# Patient Record
Sex: Female | Born: 2000 | Hispanic: Yes | Marital: Single | Smoking: Never smoker
Health system: Southern US, Community
[De-identification: ages and names within clinical notes are randomized; demographics above are authoritative.]

---

## 2018-12-31 ENCOUNTER — Emergency Department (HOSPITAL_COMMUNITY)
Admission: EM | Admit: 2018-12-31 | Discharge: 2019-01-01 | Disposition: A | Discharge status: Home / Self Care | Payer: Self-pay | Attending: Emergency Medicine | Admitting: Emergency Medicine

## 2018-12-31 ENCOUNTER — Encounter (HOSPITAL_COMMUNITY): Payer: Self-pay | Admitting: *Deleted

## 2018-12-31 DIAGNOSIS — K922 Gastrointestinal hemorrhage, unspecified: Secondary | ICD-10-CM | POA: Insufficient documentation

## 2018-12-31 LAB — COMPREHENSIVE METABOLIC PANEL
ALK PHOS: 39 U/L (ref 38–126)
ALT: 20 U/L (ref 0–44)
AST: 21 U/L (ref 15–41)
Albumin: 5.4 g/dL — ABNORMAL HIGH (ref 3.5–5.0)
Anion gap: 9 (ref 5–15)
BUN: 12 mg/dL (ref 6–20)
CALCIUM: 9.7 mg/dL (ref 8.9–10.3)
CO2: 23 mmol/L (ref 22–32)
Chloride: 107 mmol/L (ref 98–111)
Creatinine, Ser: 0.54 mg/dL (ref 0.44–1.00)
GFR calc Af Amer: >60 mL/min (ref >60)
GFR calc non Af Amer: >60 mL/min (ref >60)
Glucose, Bld: 92 mg/dL (ref 70–99)
Potassium: 3.7 mmol/L (ref 3.5–5.1)
Sodium: 139 mmol/L (ref 135–145)
Total Bilirubin: 0.4 mg/dL (ref 0.3–1.2)
Total Protein: 8.4 g/dL — ABNORMAL HIGH (ref 6.5–8.1)

## 2018-12-31 LAB — CBC
HCT: 37.5 % (ref 36.0–46.0)
Hemoglobin: 12.4 g/dL (ref 12.0–15.0)
MCH: 29.1 pg (ref 26.0–34.0)
MCHC: 33.1 g/dL (ref 30.0–36.0)
MCV: 88 fL (ref 80.0–100.0)
Platelets: 342 10*3/uL (ref 150–400)
RBC: 4.26 MIL/uL (ref 3.87–5.11)
RDW: 12.6 % (ref 11.5–15.5)
WBC: 9.7 10*3/uL (ref 4.0–10.5)
nRBC: 0 % (ref 0.0–0.2)

## 2018-12-31 LAB — URINALYSIS, ROUTINE W REFLEX MICROSCOPIC
Bilirubin Urine: NEGATIVE
GLUCOSE, UA: NEGATIVE mg/dL
Hgb urine dipstick: NEGATIVE
Ketones, ur: NEGATIVE mg/dL
Leukocytes,Ua: NEGATIVE
Nitrite: NEGATIVE
PROTEIN: NEGATIVE mg/dL
Specific Gravity, Urine: 1.02 (ref 1.005–1.030)
pH: 5 (ref 5.0–8.0)

## 2018-12-31 LAB — LIPASE, BLOOD: Lipase: 37 U/L (ref 11–51)

## 2018-12-31 LAB — I-STAT BETA HCG BLOOD, ED (MC, WL, AP ONLY): I-stat hCG, quantitative: <5 m[IU]/mL (ref <5)

## 2018-12-31 MED ORDER — SODIUM CHLORIDE 0.9% FLUSH: 3.0000 mL | Freq: Once | INTRAVENOUS | Status: DC

## 2018-12-31 MED ORDER — ONDANSETRON HCL 4 MG/2ML IJ SOLN
4.0000 mg | Freq: Once | INTRAMUSCULAR | Status: AC
  Administered 2018-12-31: 4 mg via INTRAVENOUS
  Filled 2018-12-31: qty 2

## 2018-12-31 MED ORDER — IOHEXOL 300 MG/ML  SOLN
30.0000 mL | Freq: Once | INTRAMUSCULAR | Status: AC | PRN
  Administered 2018-12-31: 30 mL via ORAL

## 2018-12-31 NOTE — ED Provider Notes (Signed)
[facility]-EMERGENCY DEPT Provider Note: Emily Dell, MD, FACEP  CSN: [id number] MRN: [id number] ARRIVAL: 12/31/18 at 1755 ROOM: WA23/[facility]23   CHIEF COMPLAINT  Rectal Bleeding   HISTORY OF PRESENT ILLNESS  12/31/18 11:03 PM Emily Garcia is a 18 y.o. female without significant past medical history.  She is here with a 3-day history of rectal bleeding.  The bleeding was mild at first but has now become severe enough to discolor the water in the toilet.  She describes it as blood around the stool.  The stool is of normal color, not black, but softer than usual.  She has not had frank diarrhea.  She has had associated nausea but no vomiting.  She has had left-sided abdominal pain which is fairly well localized, described as sharp, and rated as a 7 out of 10.  It is worse with movement or palpation.  She has not had a fever.  She has no history of similar symptoms.   History reviewed. No pertinent past medical history.  History reviewed. No pertinent surgical history.  No family history on file.  Social History   Tobacco Use  . Smoking status: Never Smoker  . Smokeless tobacco: Never Used  Substance Use Topics  . Alcohol use: Never    Frequency: Never  . Drug use: Not on file    Prior to Admission medications   Not on File    Allergies Patient has no known allergies.   REVIEW OF SYSTEMS  Negative except as noted here or in the History of Present Illness.   PHYSICAL EXAMINATION  Initial Vital Signs Blood pressure 107/63, pulse 67, temperature 98.7 F (37.1 C), temperature source Oral, resp. rate 16, weight 59.7 kg, last menstrual period 12/10/2018, SpO2 100 %.  Examination General: Well-developed, well-nourished female in no acute distress; appearance consistent with age of record HENT: normocephalic; atraumatic Eyes: pupils equal, round and reactive to light; extraocular muscles intact Neck: supple Heart: regular rate and rhythm Lungs: clear to auscultation  bilaterally Abdomen: soft; nondistended; left-sided tenderness; no masses or hepatosplenomegaly; bowel sounds present Rectal: Normal sphincter tone; no gross blood in rectal vault Extremities: No deformity; full range of motion; pulses normal Neurologic: Awake, alert; motor function intact in all extremities and symmetric; no facial droop Skin: Warm and dry Psychiatric: Normal mood and affect   RESULTS  Summary of this visit's results, reviewed by myself:   EKG Interpretation  Date/Time:    Ventricular Rate:    PR Interval:    QRS Duration:   QT Interval:    QTC Calculation:   R Axis:     Text Interpretation:        Laboratory Studies: Results for orders placed or performed during the hospital encounter of 12/31/18 (from the past 24 hour(s))  Lipase, blood     Status: None   Collection Time: 12/31/18  7:43 PM  Result Value Ref Range   Lipase 37 11 - 51 U/L  Comprehensive metabolic panel     Status: Abnormal   Collection Time: 12/31/18  7:43 PM  Result Value Ref Range   Sodium 139 135 - 145 mmol/L   Potassium 3.7 3.5 - 5.1 mmol/L   Chloride 107 98 - 111 mmol/L   CO2 23 22 - 32 mmol/L   Glucose, Bld 92 70 - 99 mg/dL   BUN 12 6 - 20 mg/dL   Creatinine, Ser 3.41 0.44 - 1.00 mg/dL   Calcium 9.7 8.9 - 96.2 mg/dL   Total Protein 8.4 (H)  6.5 - 8.1 g/dL   Albumin 5.4 (H) 3.5 - 5.0 g/dL   AST 21 15 - 41 U/L   ALT 20 0 - 44 U/L   Alkaline Phosphatase 39 38 - 126 U/L   Total Bilirubin 0.4 0.3 - 1.2 mg/dL   GFR calc non Af Amer >60 >60 mL/min   GFR calc Af Amer >60 >60 mL/min   Anion gap 9 5 - 15  CBC     Status: None   Collection Time: 12/31/18  7:43 PM  Result Value Ref Range   WBC 9.7 4.0 - 10.5 K/uL   RBC 4.26 3.87 - 5.11 MIL/uL   Hemoglobin 12.4 12.0 - 15.0 g/dL   HCT 47.4 25.9 - 56.3 %   MCV 88.0 80.0 - 100.0 fL   MCH 29.1 26.0 - 34.0 pg   MCHC 33.1 30.0 - 36.0 g/dL   RDW 87.5 64.3 - 32.9 %   Platelets 342 150 - 400 K/uL   nRBC 0.0 0.0 - 0.2 %  Urinalysis,  Routine w reflex microscopic     Status: None   Collection Time: 12/31/18  7:43 PM  Result Value Ref Range   Color, Urine YELLOW YELLOW   APPearance CLEAR CLEAR   Specific Gravity, Urine 1.020 1.005 - 1.030   pH 5.0 5.0 - 8.0   Glucose, UA NEGATIVE NEGATIVE mg/dL   Hgb urine dipstick NEGATIVE NEGATIVE   Bilirubin Urine NEGATIVE NEGATIVE   Ketones, ur NEGATIVE NEGATIVE mg/dL   Protein, ur NEGATIVE NEGATIVE mg/dL   Nitrite NEGATIVE NEGATIVE   Leukocytes,Ua NEGATIVE NEGATIVE  I-Stat beta hCG blood, ED     Status: None   Collection Time: 12/31/18  8:03 PM  Result Value Ref Range   I-stat hCG, quantitative <5.0 <5 mIU/mL   Comment 3           Imaging Studies: Ct Abdomen Pelvis W Contrast  Result Date: 01/01/2019 CLINICAL DATA:  Left abdominal and rectal pain with bowel movements. Rectal bleeding for 3 days. EXAM: CT ABDOMEN AND PELVIS WITH CONTRAST TECHNIQUE: Multidetector CT imaging of the abdomen and pelvis was performed using the standard protocol following bolus administration of intravenous contrast. CONTRAST:  ISOVUE-300 IOPAMIDOL (ISOVUE-300) INJECTION 61% COMPARISON:  None. FINDINGS: Lower chest: Lung bases are clear. Hepatobiliary: No focal liver abnormality is seen. No gallstones, gallbladder wall thickening, or biliary dilatation. Pancreas: Unremarkable. No pancreatic ductal dilatation or surrounding inflammatory changes. Spleen: Normal in size without focal abnormality. Adrenals/Urinary Tract: Adrenal glands are unremarkable. Kidneys are normal, without renal calculi, focal lesion, or hydronephrosis. Bladder is unremarkable. Stomach/Bowel: Stomach is within normal limits. Appendix appears normal. No evidence of bowel wall thickening, distention, or inflammatory changes. Vascular/Lymphatic: No significant vascular findings are present. No enlarged abdominal or pelvic lymph nodes. Reproductive: Uterus is not enlarged. Right ovarian cyst measuring 4.5 cm diameter. Likely functional.  Other: No abdominal wall hernia or abnormality. No abdominopelvic ascites. Musculoskeletal: No acute or significant osseous findings. IMPRESSION: 1. No acute process demonstrated in the abdomen or pelvis. No evidence of bowel obstruction or inflammation. No cause for rectal bleeding is identified. 2. 4.5 cm diameter right ovarian cyst, likely functional. In a premenopausal patient, no follow-up is indicated. Electronically Signed   By: Burman Nieves M.D.   On: 01/01/2019 02:15    ED COURSE and MDM  Nursing notes and initial vitals signs, including pulse oximetry, reviewed.  Vitals:   12/31/18 1821 12/31/18 1956 12/31/18 2315 01/01/19 0030  BP: 108/66 107/63 109/73 103/66  Pulse: 69 67 (!) 53 70  Resp: 18 16 16 16   Temp: 98.7 F (37.1 C)     TempSrc: Oral     SpO2: 100% 100% 100% 100%  Weight:  59.7 kg     2:35 AM CT scan reassuring.  Will treat with Zithromax for possible infectious etiology.  Patient advised to return for worsening symptoms such as severe pain, worsening bleeding or fever.  PROCEDURES    ED DIAGNOSES     ICD-10-CM   1. Lower GI bleed K92.2        Alitzel Cookson, MD 01/01/19 [phone]

## 2018-12-31 NOTE — ED Triage Notes (Signed)
Pt complains of bleeding, rectal pain with bowel movements and left sided abdominal pain over the past 3 days.

## 2018-12-31 NOTE — ED Notes (Signed)
Pt mom stated that when pt was went to the restroom here in the ED, she just had the urge to poop but very little came up. When she wiped her bottom, she noticed a little blood. Beside commode placed at bedside for stool sample

## 2019-01-01 ENCOUNTER — Encounter (HOSPITAL_COMMUNITY): Payer: Self-pay

## 2019-01-01 ENCOUNTER — Emergency Department (HOSPITAL_COMMUNITY): Payer: Self-pay

## 2019-01-01 MED ORDER — AZITHROMYCIN 250 MG PO TABS: 500.0000 mg | ORAL_TABLET | Freq: Every day | ORAL | 0 refills | Status: AC

## 2019-01-01 MED ORDER — AZITHROMYCIN 250 MG PO TABS
500.0000 mg | ORAL_TABLET | Freq: Once | ORAL | Status: AC
  Administered 2019-01-01: 500 mg via ORAL
  Filled 2019-01-01: qty 2

## 2019-01-01 MED ORDER — SODIUM CHLORIDE (PF) 0.9 % IJ SOLN
INTRAMUSCULAR | Status: AC
  Filled 2019-01-01: qty 50

## 2019-01-01 MED ORDER — IOPAMIDOL (ISOVUE-300) INJECTION 61%
100.0000 mL | Freq: Once | INTRAVENOUS | Status: AC | PRN
  Administered 2019-01-01: 100 mL via INTRAVENOUS

## 2019-01-01 MED ORDER — IOPAMIDOL (ISOVUE-300) INJECTION 61%
INTRAVENOUS | Status: AC
  Administered 2019-01-01: 100 mL via INTRAVENOUS
  Filled 2019-01-01: qty 100

## 2019-01-01 NOTE — ED Notes (Signed)
Pt back from CT

## 2020-07-18 ENCOUNTER — Encounter (HOSPITAL_COMMUNITY): Payer: Self-pay

## 2020-07-18 ENCOUNTER — Other Ambulatory Visit: Payer: Self-pay

## 2020-07-18 ENCOUNTER — Emergency Department (HOSPITAL_COMMUNITY): Payer: HRSA Program

## 2020-07-18 ENCOUNTER — Emergency Department (HOSPITAL_COMMUNITY)
Admission: EM | Admit: 2020-07-18 | Discharge: 2020-07-18 | Disposition: A | Discharge status: Home / Self Care | Payer: HRSA Program | Attending: Emergency Medicine | Admitting: Emergency Medicine

## 2020-07-18 DIAGNOSIS — U071 COVID-19: Secondary | ICD-10-CM | POA: Diagnosis not present

## 2020-07-18 DIAGNOSIS — R0602 Shortness of breath: Secondary | ICD-10-CM | POA: Diagnosis not present

## 2020-07-18 NOTE — Discharge Instructions (Addendum)
Drink plenty of fluids and get plenty of rest.  Tylenol 650 mg rotated with ibuprofen 400 mg every 3 hours as needed for pain or fever.  Return to the emergency department if you develop severe chest pain, worsening breathing, or other new and concerning symptoms.  You are to isolate at home until you feel better +1-week.      Person Under Monitoring Name: Emily Garcia  Location: [address] [place] [address]   Infection Prevention Recommendations for Individuals Confirmed to have, or Being Evaluated for, 2019 Novel Coronavirus (COVID-19) Infection Who Receive Care at Home  Individuals who are confirmed to have, or are being evaluated for, COVID-19 should follow the prevention steps below until a healthcare provider or local or state health department says they can return to normal activities.  Stay home except to get medical care You should restrict activities outside your home, except for getting medical care. Do not go to work, school, or public areas, and do not use public transportation or taxis.  Call ahead before visiting your doctor Before your medical appointment, call the healthcare provider and tell them that you have, or are being evaluated for, COVID-19 infection. This will help the healthcare provider's office take steps to keep other people from getting infected. Ask your healthcare provider to call the local or state health department.  Monitor your symptoms Seek prompt medical attention if your illness is worsening (e.g., difficulty breathing). Before going to your medical appointment, call the healthcare provider and tell them that you have, or are being evaluated for, COVID-19 infection. Ask your healthcare provider to call the local or state health department.  Wear a facemask You should wear a facemask that covers your nose and mouth when you are in the same room with other people and when you visit a healthcare provider. People who  live with or visit you should also wear a facemask while they are in the same room with you.  Separate yourself from other people in your home As much as possible, you should stay in a different room from other people in your home. Also, you should use a separate bathroom, if available.  Avoid sharing household items You should not share dishes, drinking glasses, cups, eating utensils, towels, bedding, or other items with other people in your home. After using these items, you should wash them thoroughly with soap and water.  Cover your coughs and sneezes Cover your mouth and nose with a tissue when you cough or sneeze, or you can cough or sneeze into your sleeve. Throw used tissues in a lined trash can, and immediately wash your hands with soap and water for at least 20 seconds or use an alcohol-based hand rub.  Wash your [employer] your hands often and thoroughly with soap and water for at least 20 seconds. You can use an alcohol-based hand sanitizer if soap and water are not available and if your hands are not visibly dirty. Avoid touching your eyes, nose, and mouth with unwashed hands.   Prevention Steps for Caregivers and Household Members of Individuals Confirmed to have, or Being Evaluated for, COVID-19 Infection Being Cared for in the Home  If you live with, or provide care at home for, a person confirmed to have, or being evaluated for, COVID-19 infection please follow these guidelines to prevent infection:  Follow healthcare provider's instructions Make sure that you understand and can help the patient follow any healthcare provider instructions for all care.  Provide for  the patient's basic needs You should help the patient with basic needs in the home and provide support for getting groceries, prescriptions, and other personal needs.  Monitor the patient's symptoms If they are getting sicker, call his or her medical provider and tell them that the patient has, or is  being evaluated for, COVID-19 infection. This will help the healthcare provider's office take steps to keep other people from getting infected. Ask the healthcare provider to call the local or state health department.  Limit the number of people who have contact with the patient If possible, have only one caregiver for the patient. Other household members should stay in another home or place of residence. If this is not possible, they should stay in another room, or be separated from the patient as much as possible. Use a separate bathroom, if available. Restrict visitors who do not have an essential need to be in the home.  Keep older adults, very young children, and other sick people away from the patient Keep older adults, very young children, and those who have compromised immune systems or chronic health conditions away from the patient. This includes people with chronic heart, lung, or kidney conditions, diabetes, and cancer.  Ensure good ventilation Make sure that shared spaces in the home have good air flow, such as from an air conditioner or an opened window, weather permitting.  Wash your hands often Wash your hands often and thoroughly with soap and water for at least 20 seconds. You can use an alcohol based hand sanitizer if soap and water are not available and if your hands are not visibly dirty. Avoid touching your eyes, nose, and mouth with unwashed hands. Use disposable paper towels to dry your hands. If not available, use dedicated cloth towels and replace them when they become wet.  Wear a facemask and gloves Wear a disposable facemask at all times in the room and gloves when you touch or have contact with the patient's blood, body fluids, and/or secretions or excretions, such as sweat, saliva, sputum, nasal mucus, vomit, urine, or feces.  Ensure the mask fits over your nose and mouth tightly, and do not touch it during use. Throw out disposable facemasks and gloves after  using them. Do not reuse. Wash your hands immediately after removing your facemask and gloves. If your personal clothing becomes contaminated, carefully remove clothing and launder. Wash your hands after handling contaminated clothing. Place all used disposable facemasks, gloves, and other waste in a lined container before disposing them with other household waste. Remove gloves and wash your hands immediately after handling these items.  Do not share dishes, glasses, or other household items with the patient Avoid sharing household items. You should not share dishes, drinking glasses, cups, eating utensils, towels, bedding, or other items with a patient who is confirmed to have, or being evaluated for, COVID-19 infection. After the person uses these items, you should wash them thoroughly with soap and water.  Wash laundry thoroughly Immediately remove and wash clothes or bedding that have blood, body fluids, and/or secretions or excretions, such as sweat, saliva, sputum, nasal mucus, vomit, urine, or feces, on them. Wear gloves when handling laundry from the patient. Read and follow directions on labels of laundry or clothing items and detergent. In general, wash and dry with the warmest temperatures recommended on the label.  Clean all areas the individual has used often Clean all touchable surfaces, such as counters, tabletops, doorknobs, bathroom fixtures, toilets, phones, keyboards, tablets, and bedside  tables, every day. Also, clean any surfaces that may have blood, body fluids, and/or secretions or excretions on them. Wear gloves when cleaning surfaces the patient has come in contact with. Use a diluted bleach solution (e.g., dilute bleach with 1 part bleach and 10 parts water) or a household disinfectant with a label that says EPA-registered for coronaviruses. To make a bleach solution at home, add 1 tablespoon of bleach to 1 quart (4 cups) of water. For a larger supply, add  cup of bleach  to 1 gallon (16 cups) of water. Read labels of cleaning products and follow recommendations provided on product labels. Labels contain instructions for safe and effective use of the cleaning product including precautions you should take when applying the product, such as wearing gloves or eye protection and making sure you have good ventilation during use of the product. Remove gloves and wash hands immediately after cleaning.  Monitor yourself for signs and symptoms of illness Caregivers and household members are considered close contacts, should monitor their health, and will be asked to limit movement outside of the home to the extent possible. Follow the monitoring steps for close contacts listed on the symptom monitoring form.   ? If you have additional questions, contact your local health department or call the epidemiologist on call at [phone] (available 24/7). ? This guidance is subject to change. For the most up-to-date guidance from [facility], please refer to their website: [email or web address]

## 2020-07-18 NOTE — ED Triage Notes (Signed)
Patient reports that she tested Covid + yesterday.  Patient c/o SOB, cough,generalized body pain, headache, and fever.

## 2020-07-18 NOTE — ED Provider Notes (Signed)
[facility]-EMERGENCY DEPT Provider Note   CSN: [id number] Arrival date & time: 07/18/20  1200     History Chief Complaint  Patient presents with  . Covid Positive  . Shortness of Breath    Emily Garcia is a 19 y.o. female.  Patient is a 19 year old female with no significant past medical history.  She presents today for evaluation of cough, shortness of breath, and body aches.  Patient diagnosed with Covid yesterday.  She denies any aggravating or alleviating factors.  The history is provided by the patient.  Shortness of Breath Severity:  Moderate Onset quality:  Gradual Duration:  3 days Timing:  Constant Progression:  Worsening Chronicity:  New Context: URI   Relieved by:  Nothing Worsened by:  Nothing Ineffective treatments:  None tried      History reviewed. No pertinent past medical history.  There are no problems to display for this patient.   History reviewed. No pertinent surgical history.   OB History   No obstetric history on file.     History reviewed. No pertinent family history.  Social History   Tobacco Use  . Smoking status: Never Smoker  . Smokeless tobacco: Never Used  Vaping Use  . Vaping Use: Never used  Substance Use Topics  . Alcohol use: Never  . Drug use: Never    Home Medications Prior to Admission medications   Medication Sig Start Date End Date Taking? Authorizing Provider  azithromycin (ZITHROMAX) 250 MG tablet Take 2 tablets (500 mg total) by mouth daily. 01/01/19   Molpus, Jonny Ruiz, MD    Allergies    Patient has no known allergies.  Review of Systems   Review of Systems  Respiratory: Positive for shortness of breath.   All other systems reviewed and are negative.   Physical Exam Updated Vital Signs BP 117/77 (BP Location: Left Arm)   Pulse 85   Temp 98.4 F (36.9 C) (Oral)   Resp 16   Ht 5' (1.524 m)   Wt 59 kg   LMP 06/24/2020   SpO2 99%   BMI 25.39 kg/m   Physical  Exam Vitals and nursing note reviewed.  Constitutional:      General: She is not in acute distress.    Appearance: She is well-developed. She is not diaphoretic.  HENT:     Head: Normocephalic and atraumatic.  Cardiovascular:     Rate and Rhythm: Normal rate and regular rhythm.     Heart sounds: No murmur heard.  No friction rub. No gallop.   Pulmonary:     Effort: Pulmonary effort is normal. No respiratory distress.     Breath sounds: Normal breath sounds. No wheezing.  Abdominal:     General: Bowel sounds are normal. There is no distension.     Palpations: Abdomen is soft.     Tenderness: There is no abdominal tenderness.  Musculoskeletal:        General: Normal range of motion.     Cervical back: Normal range of motion and neck supple.  Skin:    General: Skin is warm and dry.  Neurological:     Mental Status: She is alert and oriented to person, place, and time.     ED Results / Procedures / Treatments   Labs (all labs ordered are listed, but only abnormal results are displayed) Labs Reviewed - No data to display  EKG None  Radiology DG Chest 2 View  Result Date: 07/18/2020 CLINICAL DATA:  Cough and shortness  of breath.  COVID-19 positive. EXAM: CHEST - 2 VIEW COMPARISON:  None. FINDINGS: The heart size and mediastinal contours are within normal limits. Normal pulmonary vascularity. Subtle increased interstitial opacity at the lung bases. No focal consolidation, pleural effusion, or pneumothorax. No acute osseous abnormality. IMPRESSION: 1. Subtle increased interstitial opacity at the lung bases, suspicious for viral pneumonia given clinical history. Electronically Signed   By: Obie Dredge M.D.   On: 07/18/2020 13:49    Procedures Procedures (including critical care time)  Medications Ordered in ED Medications - No data to display  ED Course  I have reviewed the triage vital signs and the nursing notes.  Pertinent labs & imaging results that were available  during my care of the patient were reviewed by me and considered in my medical decision making (see chart for details).    MDM Rules/Calculators/A&P  Patient presenting today with complaints of Covid-like symptoms.  She feels short of breath, but vitals are stable and oxygen saturations are 99 to 100% while I am evaluating her.  There is no tachypnea, hypotension, or tachycardia.  Patient is well-appearing clinically and I see no indication for any specific treatment at this time.  Patient will be discharged with alternating Tylenol and ibuprofen, plenty of fluids, and as needed return if her symptoms worsen.  She is to isolate at home.  Emily Garcia was evaluated in Emergency Department on 07/18/2020 for the symptoms described in the history of present illness. She was evaluated in the context of the global COVID-19 pandemic, which necessitated consideration that the patient might be at risk for infection with the SARS-CoV-2 virus that causes COVID-19. Institutional protocols and algorithms that pertain to the evaluation of patients at risk for COVID-19 are in a state of rapid change based on information released by regulatory bodies including the CDC and federal and state organizations. These policies and algorithms were followed during the patient's care in the ED.  Final Clinical Impression(s) / ED Diagnoses Final diagnoses:  None    Rx / DC Orders ED Discharge Orders    None       Geoffery Lyons, MD 07/18/20 1653

## 2021-06-19 ENCOUNTER — Encounter (HOSPITAL_COMMUNITY): Payer: Self-pay

## 2021-06-19 ENCOUNTER — Emergency Department (HOSPITAL_COMMUNITY)
Admission: EM | Admit: 2021-06-19 | Discharge: 2021-06-20 | Disposition: A | Discharge status: Home / Self Care | Payer: Self-pay | Attending: Emergency Medicine | Admitting: Emergency Medicine

## 2021-06-19 ENCOUNTER — Other Ambulatory Visit: Payer: Self-pay

## 2021-06-19 ENCOUNTER — Emergency Department (HOSPITAL_COMMUNITY): Payer: Self-pay

## 2021-06-19 DIAGNOSIS — R519 Headache, unspecified: Secondary | ICD-10-CM

## 2021-06-19 DIAGNOSIS — S0181XA Laceration without foreign body of other part of head, initial encounter: Secondary | ICD-10-CM | POA: Insufficient documentation

## 2021-06-19 DIAGNOSIS — Z23 Encounter for immunization: Secondary | ICD-10-CM | POA: Insufficient documentation
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
## 2021-06-20 MED ORDER — OXYCODONE-ACETAMINOPHEN 5-325 MG PO TABS
2.0000 | ORAL_TABLET | Freq: Once | ORAL | Status: AC
  Administered 2021-06-20: 2 via ORAL
  Filled 2021-06-20: qty 2

## 2021-06-20 MED ORDER — TETANUS-DIPHTH-ACELL PERTUSSIS 5-2.5-18.5 LF-MCG/0.5 IM SUSY
0.5000 mL | PREFILLED_SYRINGE | Freq: Once | INTRAMUSCULAR | Status: AC
  Administered 2021-06-20: 0.5 mL via INTRAMUSCULAR
  Filled 2021-06-20: qty 0.5

## 2021-06-20 MED ORDER — KETOROLAC TROMETHAMINE 60 MG/2ML IM SOLN
30.0000 mg | Freq: Once | INTRAMUSCULAR | Status: AC
  Administered 2021-06-20: 30 mg via INTRAMUSCULAR
  Filled 2021-06-20: qty 2
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
